# Patient Record
Sex: Female | Born: 1955 | Race: White | Hispanic: No | Marital: Married | Smoking: Never smoker
Health system: Southern US, Community
[De-identification: ages and names within clinical notes are randomized; demographics above are authoritative.]

## PROBLEM LIST (undated history)

## (undated) DIAGNOSIS — N946 Dysmenorrhea, unspecified: Secondary | ICD-10-CM

## (undated) DIAGNOSIS — G4700 Insomnia, unspecified: Secondary | ICD-10-CM

## (undated) DIAGNOSIS — R32 Unspecified urinary incontinence: Secondary | ICD-10-CM

## (undated) DIAGNOSIS — N3010 Interstitial cystitis (chronic) without hematuria: Secondary | ICD-10-CM

## (undated) DIAGNOSIS — I10 Essential (primary) hypertension: Secondary | ICD-10-CM

## (undated) DIAGNOSIS — D649 Anemia, unspecified: Secondary | ICD-10-CM

## (undated) DIAGNOSIS — N939 Abnormal uterine and vaginal bleeding, unspecified: Secondary | ICD-10-CM

## (undated) DIAGNOSIS — R011 Cardiac murmur, unspecified: Secondary | ICD-10-CM

## (undated) DIAGNOSIS — Z5189 Encounter for other specified aftercare: Secondary | ICD-10-CM

## (undated) HISTORY — DX: Unspecified urinary incontinence: R32

## (undated) HISTORY — PX: DILATION AND CURETTAGE OF UTERUS: SHX78

## (undated) HISTORY — PX: COLPOSCOPY: SHX161

## (undated) HISTORY — DX: Essential (primary) hypertension: I10

## (undated) HISTORY — DX: Cardiac murmur, unspecified: R01.1

## (undated) HISTORY — DX: Encounter for other specified aftercare: Z51.89

## (undated) HISTORY — DX: Dysmenorrhea, unspecified: N94.6

## (undated) HISTORY — DX: Abnormal uterine and vaginal bleeding, unspecified: N93.9

## (undated) HISTORY — PX: TONSILLECTOMY: SUR1361

## (undated) HISTORY — PX: OTHER SURGICAL HISTORY: SHX169

## (undated) HISTORY — DX: Anemia, unspecified: D64.9

## (undated) HISTORY — DX: Insomnia, unspecified: G47.00

## (undated) HISTORY — DX: Interstitial cystitis (chronic) without hematuria: N30.10

## (undated) HISTORY — PX: GYNECOLOGIC CRYOSURGERY: SHX857

---

## 1980-02-12 DIAGNOSIS — Z5189 Encounter for other specified aftercare: Secondary | ICD-10-CM

## 1980-02-12 HISTORY — DX: Encounter for other specified aftercare: Z51.89

## 1982-02-11 HISTORY — PX: CHOLECYSTECTOMY: SHX55

## 1982-02-11 HISTORY — PX: APPENDECTOMY: SHX54

## 1997-02-11 HISTORY — PX: UMBILICAL HERNIA REPAIR: SHX196

## 1998-02-11 HISTORY — PX: BREAST BIOPSY: SHX20

## 2014-11-04 ENCOUNTER — Encounter: Payer: Self-pay | Admitting: Internal Medicine

## 2015-10-10 ENCOUNTER — Other Ambulatory Visit: Payer: Self-pay | Admitting: Internal Medicine

## 2015-10-10 DIAGNOSIS — R1011 Right upper quadrant pain: Secondary | ICD-10-CM

## 2015-10-19 ENCOUNTER — Ambulatory Visit
Admission: RE | Admit: 2015-10-19 | Discharge: 2015-10-19 | Disposition: A | Discharge status: Home / Self Care | Payer: BLUE CROSS/BLUE SHIELD | Source: Ambulatory Visit | Attending: Internal Medicine | Admitting: Internal Medicine

## 2015-10-19 DIAGNOSIS — R1011 Right upper quadrant pain: Secondary | ICD-10-CM

## 2016-01-11 ENCOUNTER — Ambulatory Visit (INDEPENDENT_AMBULATORY_CARE_PROVIDER_SITE_OTHER): Payer: BLUE CROSS/BLUE SHIELD | Admitting: Obstetrics & Gynecology

## 2016-01-11 ENCOUNTER — Encounter: Payer: Self-pay | Admitting: Obstetrics & Gynecology

## 2016-01-11 VITALS — BP 106/56 | HR 60 | Resp 12 | Ht 67.0 in | Wt 197.0 lb

## 2016-01-11 DIAGNOSIS — N3010 Interstitial cystitis (chronic) without hematuria: Secondary | ICD-10-CM | POA: Insufficient documentation

## 2016-01-11 DIAGNOSIS — R740 Nonspecific elevation of levels of transaminase and lactic acid dehydrogenase [LDH]: Secondary | ICD-10-CM

## 2016-01-11 DIAGNOSIS — Z86010 Personal history of colonic polyps: Secondary | ICD-10-CM

## 2016-01-11 DIAGNOSIS — I10 Essential (primary) hypertension: Secondary | ICD-10-CM

## 2016-01-11 DIAGNOSIS — N8111 Cystocele, midline: Secondary | ICD-10-CM | POA: Diagnosis not present

## 2016-01-11 DIAGNOSIS — N812 Incomplete uterovaginal prolapse: Secondary | ICD-10-CM

## 2016-01-11 DIAGNOSIS — Z01419 Encounter for gynecological examination (general) (routine) without abnormal findings: Secondary | ICD-10-CM

## 2016-01-11 DIAGNOSIS — Z205 Contact with and (suspected) exposure to viral hepatitis: Secondary | ICD-10-CM | POA: Diagnosis not present

## 2016-01-11 DIAGNOSIS — R7401 Elevation of levels of liver transaminase levels: Secondary | ICD-10-CM | POA: Insufficient documentation

## 2016-01-11 DIAGNOSIS — Z124 Encounter for screening for malignant neoplasm of cervix: Secondary | ICD-10-CM | POA: Diagnosis not present

## 2016-01-11 DIAGNOSIS — Z1211 Encounter for screening for malignant neoplasm of colon: Secondary | ICD-10-CM | POA: Diagnosis not present

## 2016-01-11 MED ORDER — ESTROGENS, CONJUGATED 0.625 MG/GM VA CREA: TOPICAL_CREAM | VAGINAL | 2 refills | Status: AC

## 2016-01-11 NOTE — Progress Notes (Signed)
60 y.o. G3P3 MarriedCaucasianF here for annual exam.  Her neighbor is a patient of mine.  Pt moved from [place] a couple of years ago.  Needs to be established with her gyn care her in the [place] area.  Pt has been menopausal for several years.  H/O IC.  Uses topical premarin cream to help with bladder symptoms.  Has done this for several years.  Uses twice weekly or less.  This has really helped in her opinion  Does see urologist, Dr. Marlou PorchHerrick, at [facility].  Used Elmiron in the past.  Off it now.  Also seeing Amalia GreenhouseWilda in PT.  Has some prolapse symptoms.  Wonders about when surgery will be needed.  Does want to address this today.   No LMP recorded. Patient is postmenopausal.          Sexually active: Yes.    The current method of family planning is post menopausal status.    Exercising: Yes.    walking, strength training  Smoker:  no  Health Maintenance: Pap:  06/03/12 in [place]  History of abnormal Pap:  yes [facility]:  [facility]  Colonoscopy:  02/15/11 in [place]- polyps- repeat 3-5 years per patient BMD:   09/13/15 with PCP  TDaP:  12/10/10  Pneumonia vaccine(s):  never Zostavax:   never Hep C testing: drawn today  Screening Labs: PCP, Hb today: PCP, Urine today: sees a urologist   reports that she has never smoked. She has never used smokeless tobacco. She reports that she drinks alcohol. She reports that she does not use drugs.  Past Medical History:  Diagnosis Date  . Abnormal uterine bleeding   . Anemia   . Blood transfusion without reported diagnosis 1982   after birth of first child  . Blood transfusion without reported diagnosis 2008   bleeding from menopause  . Dysmenorrhea   . Heart murmur   . Hypertension   . Insomnia   . Interstitial cystitis    urologist- Dr. Marlou PorchHerrick   . Urinary incontinence     Past Surgical History:  Procedure Laterality Date  . APPENDECTOMY  1984  . BREAST BIOPSY  2000  . CHOLECYSTECTOMY  1984  . COLPOSCOPY    . DILATION AND  CURETTAGE OF UTERUS    . GYNECOLOGIC CRYOSURGERY    . TONSILLECTOMY    . UMBILICAL HERNIA REPAIR  1999    Current Outpatient Prescriptions  Medication Sig Dispense Refill  . amlodipine-olmesartan (AZOR) 10-20 MG tablet     . Cholecalciferol (VITAMIN D3 PO) Take 2,000 Int'l Units by mouth daily.    Marland Kitchen. conjugated estrogens (PREMARIN) vaginal cream Place 1 Applicatorful vaginally once a week.    . metoprolol (LOPRESSOR) 50 MG tablet     . diazepam (VALIUM) 10 MG tablet     . meloxicam (MOBIC) 15 MG tablet      No current facility-administered medications for this visit.     Family History  Problem Relation Age of Onset  . Diabetes Mother   . Diabetes Father   . Stroke Father   . Leukemia Sister   . Diabetes Brother   . Stroke Maternal Grandmother   . Stroke Paternal Grandmother   . Stroke Paternal Grandfather   . Osteoporosis Sister   . Diabetes Brother     ROS:  Pertinent items are noted in HPI.  Otherwise, a comprehensive ROS was negative.  Exam:   BP (!) 106/56 (BP Location: Right Arm, Patient Position: Sitting, Cuff Size: Normal)  Pulse 60   Resp 12   Ht 5\' 7"  (1.702 m)   Wt 197 lb (89.4 kg)   BMI 30.85 kg/m     Height: 5\' 7"  (170.2 cm)  Ht Readings from Last 3 Encounters:  01/11/16 5\' 7"  (1.702 m)    General appearance: alert, cooperative and appears stated age Head: Normocephalic, without obvious abnormality, atraumatic Neck: no adenopathy, supple, symmetrical, trachea midline and thyroid normal to inspection and palpation Lungs: clear to auscultation bilaterally Breasts: normal appearance, no masses or tenderness Heart: regular rate and rhythm Abdomen: soft, non-tender; bowel sounds normal; no masses,  no organomegaly Extremities: extremities normal, atraumatic, no cyanosis or edema Skin: Skin color, texture, turgor normal. No rashes or lesions Lymph nodes: Cervical, supraclavicular, and axillary nodes normal. No abnormal inguinal nodes  palpated Neurologic: Grossly normal   Pelvic: External genitalia:  no lesions              Urethra:  normal appearing urethra with no masses, tenderness or lesions              Bartholins and Skenes: normal                 Vagina: normal appearing vagina with normal color and discharge, no lesions, 3rd degree cystocele              Cervix: no lesions              Pap taken: Yes.   Bimanual Exam:  Uterus:  normal size, contour, position, consistency, mobility, non-tender, second degree uterine prolapse              Adnexa: normal adnexa and no mass, fullness, tenderness               Rectovaginal: Confirms               Anus:  normal sphincter tone, no lesions  Chaperone was present for exam.  A:  Well Woman with normal exam Hypertension H/O blood transfusion x 2 after pregnancies IC, followed by Dr. Marlou PorchHerrick Incomplete uterine prolapse with 3rd degree cystocele H/O colon polyps, due for colonoscopy H/O elevated ALT, off elmiron at this time  P:   Mammogram guidelines reviewed pap smear and HR HPV obtained today Rx for premarin cream to be used topically up to twice weekly.  Rx printed for her to use in the futue Referral to Dr. Loreta AveMann for colonoscopy.  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  I think Dr. Kenna GilbertMann's office will be able to work with her. Pessary use vs surgical correction for prolapse symptoms/findings discussed.  Pt does feel PT has helped and this will likely minimal worsening of symptoms.  Weight loss will help as well.  For now, pt wants to wait and watch. Hep C testing obtained today. Return annually or prn  In addition to new patient visit and exam as well as health maintenance issues that were addressed about 15 additional minutes spent in face to face discussion of incomplete uterine prolapse, possible surgical corrections, pessary use, timing, recovery, hospital stay, possible worsening of IC related to surgery.  All questions regarding this  answered.  Total time in face to face discussion and doing physical exam for visit today was about 45 minutes.

## 2016-01-12 LAB — HEPATITIS C ANTIBODY: HCV AB: NEGATIVE

## 2016-01-15 LAB — IPS PAP TEST WITH HPV

## 2016-01-16 ENCOUNTER — Telehealth: Payer: Self-pay | Admitting: Obstetrics & Gynecology

## 2016-01-16 NOTE — Telephone Encounter (Signed)
Patient returned call. She advised that she is not able to attended appointment scheduled with Dr Kenna GilbertMann's office on 01/18/16. Patient states, she had previously spoken with Dr Kenna GilbertMann's office and advised their office, she will not be able to scheduled until January 2018. Patient states she will receive her January work schedule by the end of this week and will at that time call to scheduled recommended tests in January.  Routing to Dr Hyacinth MeekerMiller for final review

## 2016-01-16 NOTE — Telephone Encounter (Signed)
Called patient to convey referral appointment information for [facility]. Left message on machine requesting a call back, in order to provide the patient information for appointment with Dr Loreta AveMann.

## 2017-09-30 ENCOUNTER — Ambulatory Visit: Payer: BLUE CROSS/BLUE SHIELD | Admitting: Obstetrics & Gynecology

## 2018-01-12 IMAGING — US US ABDOMEN COMPLETE
1 series · 14 of 25 positions shown · non-contrast
Comparison: None in PACs

CLINICAL DATA: Right upper quadrant abdominal pain and bloating for
the past month. History of previous cholecystectomy.

EXAM:
ABDOMEN ULTRASOUND COMPLETE

[Series 1: us abdomen complete · 0.23mm/px · 14 of 76 slices shown]
[im 1/76]
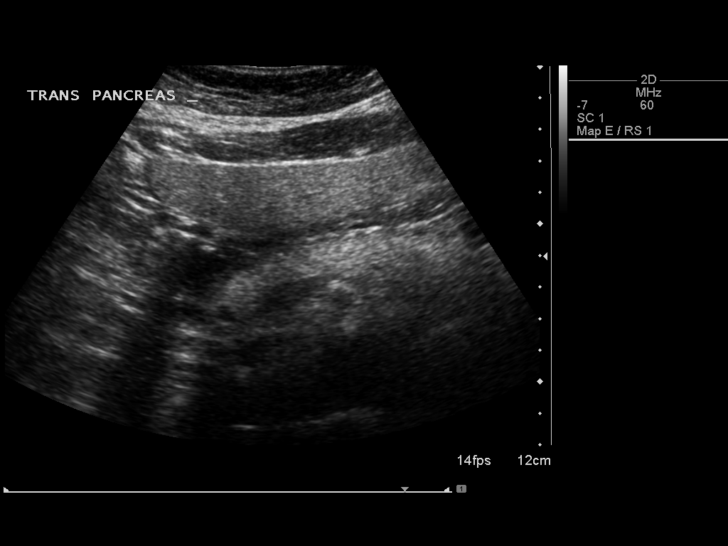
[im 7/76]
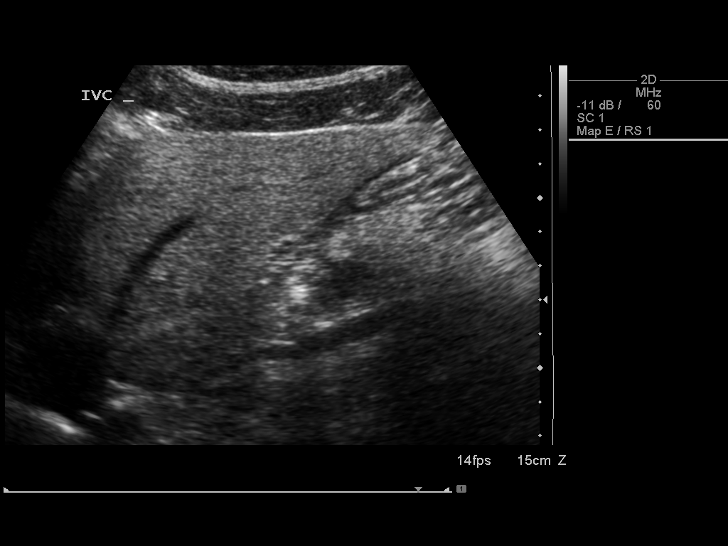
[im 13/76]
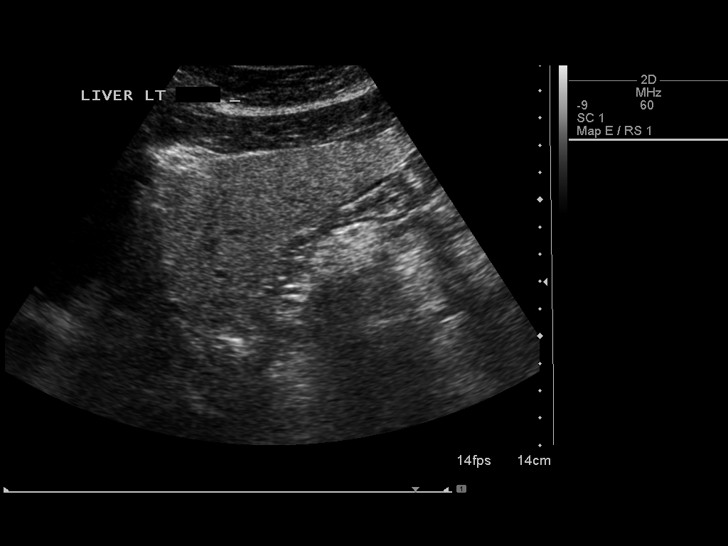
[im 19/76]
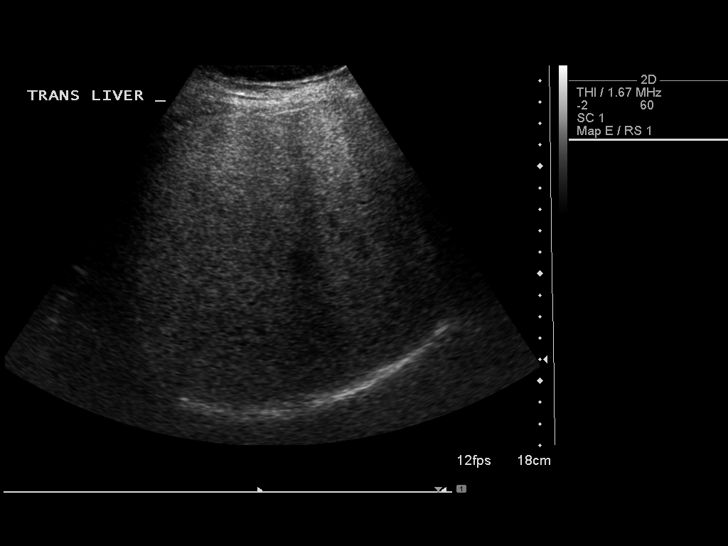
[im 26/76]
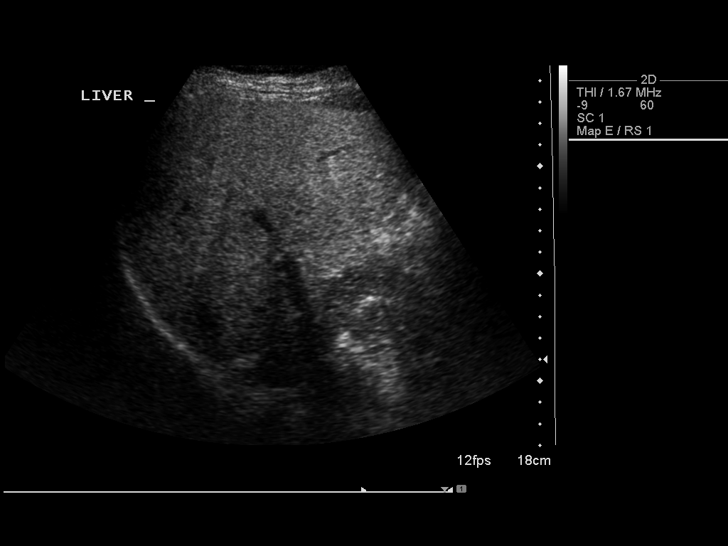
[im 29/76]
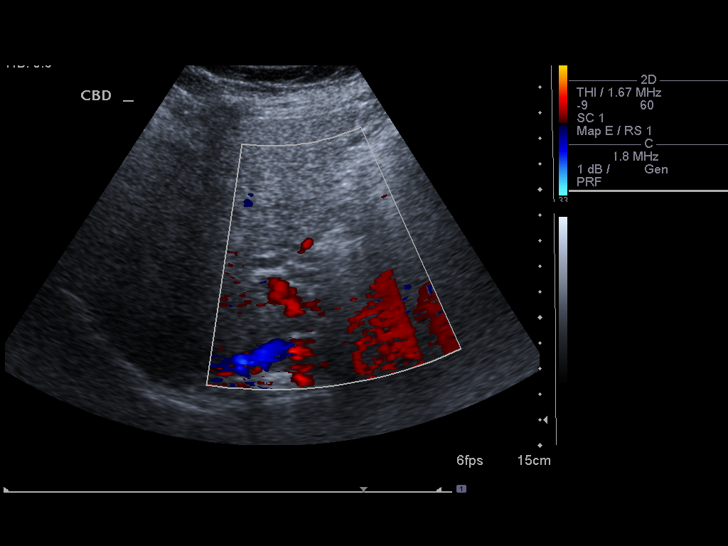
[im 35/76]
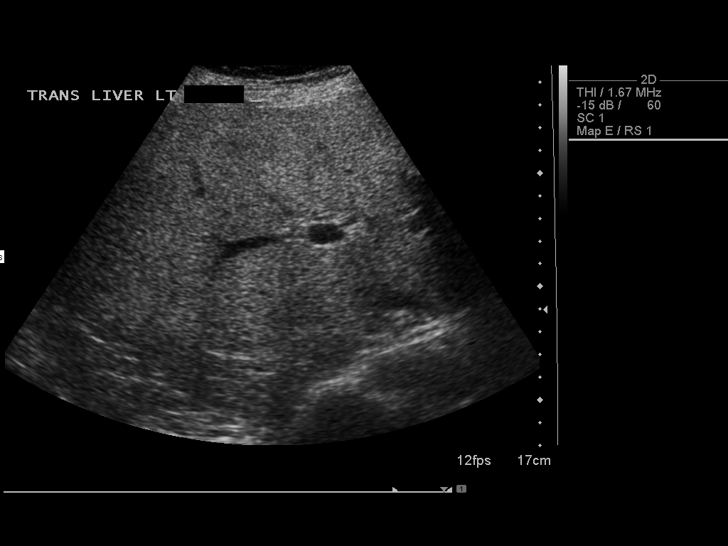
[im 41/76]
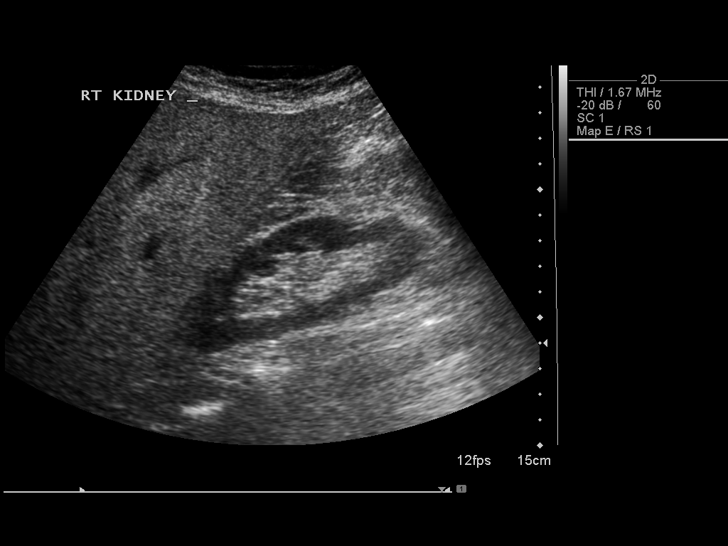
[im 47/76]
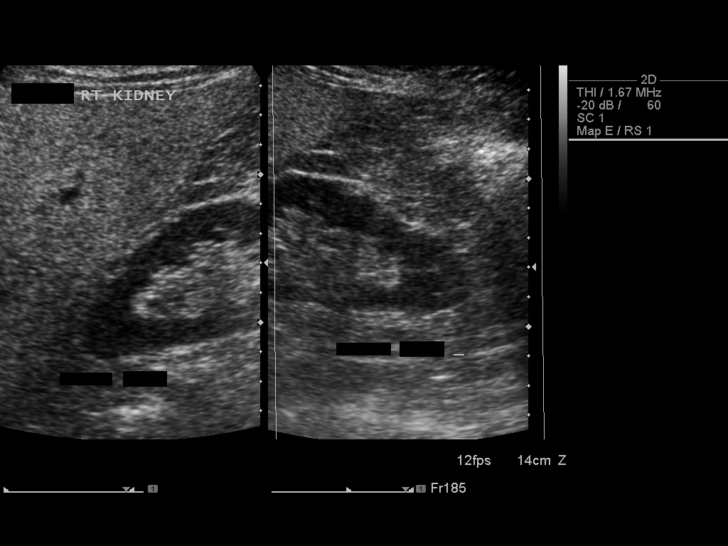
[im 51/76]
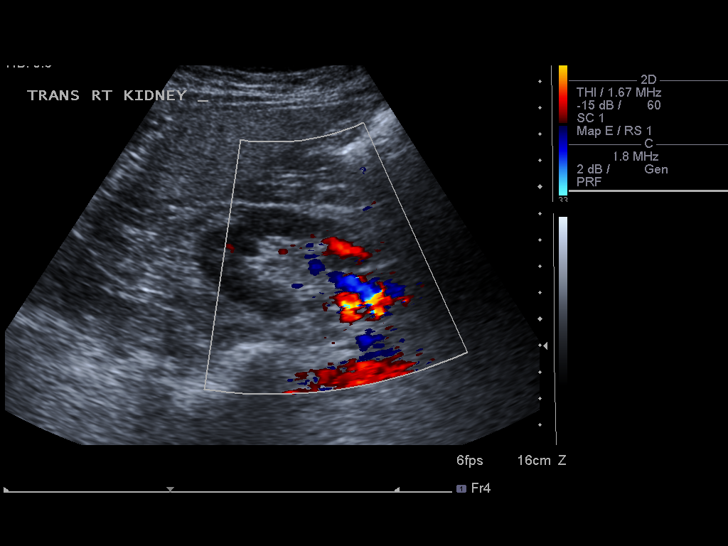
[im 57/76]
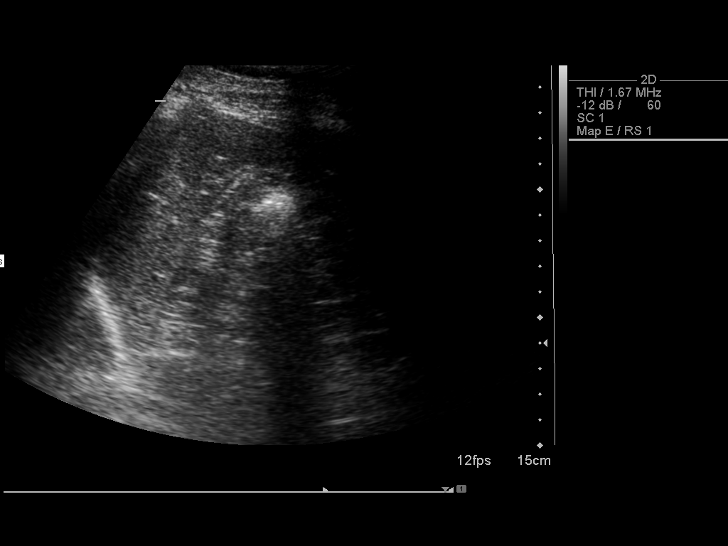
[im 63/76]
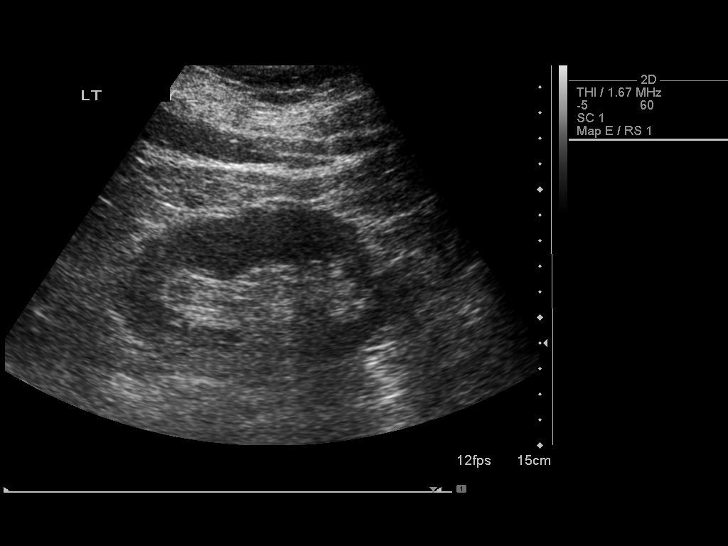
[im 69/76]
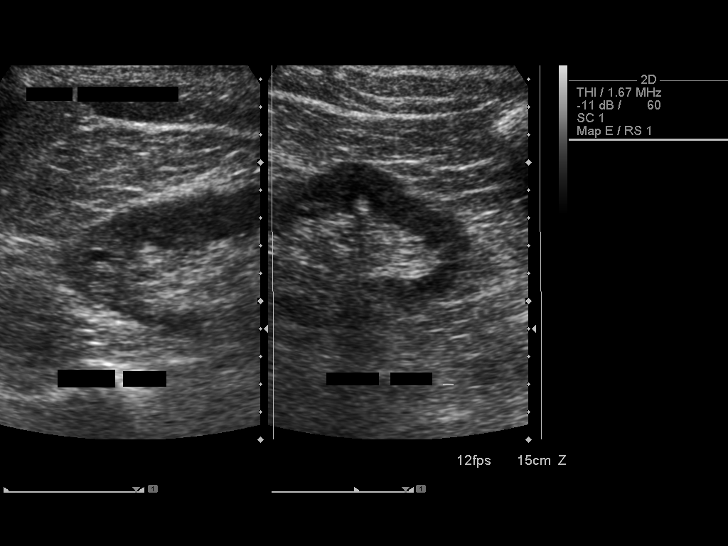
[im 76/76]
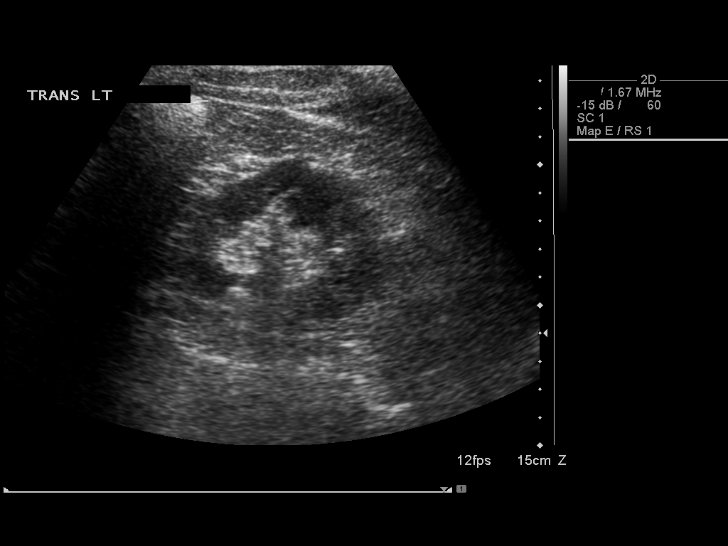

[14 of 25 positions shown; findings below may reference images not displayed]

FINDINGS: Gallbladder: The gallbladder is surgically absent.

Common bile duct: Diameter: 4.5 mm

Liver: The hepatic echotexture is mildly increased diffusely. There
is no focal mass or ductal dilation.

IVC: No abnormality visualized.

Pancreas: Visualized portion unremarkable.

Spleen: Size and appearance within normal limits.

Right Kidney: Length: 11.4 cm.. Echogenicity within normal limits.
No mass or hydronephrosis visualized.

Left Kidney: Length: 11.1 cm.. Echogenicity within normal limits. No
solid mass or hydronephrosis visualized. There is a an 8 mm upper
pole simple appearing cyst in the cortex.

Abdominal aorta: No aneurysm visualized.

Other findings: There is no ascites.
IMPRESSION: 1. Previous cholecystectomy. Increased hepatic echotexture
compatible with fatty infiltrative change. No abnormality of the
pancreas or common bile duct.
2. No acute abnormality observed elsewhere within the abdomen.
